# Patient Record
Sex: Male | Born: 1985 | Race: White | Hispanic: No | Marital: Single | Smoking: Current every day smoker
Health system: Southern US, Community
[De-identification: ages and names within clinical notes are randomized; demographics above are authoritative.]

---
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
## 2004-08-31 ENCOUNTER — Emergency Department: Payer: Self-pay | Admitting: Emergency Medicine

## 2007-02-13 ENCOUNTER — Emergency Department (HOSPITAL_COMMUNITY): Admission: EM | Admit: 2007-02-13 | Discharge: 2007-02-13 | Payer: Self-pay | Admitting: Family Medicine

## 2007-07-05 ENCOUNTER — Emergency Department: Payer: Self-pay | Admitting: Emergency Medicine

## 2008-07-17 ENCOUNTER — Emergency Department (HOSPITAL_COMMUNITY): Admission: EM | Admit: 2008-07-17 | Discharge: 2008-07-17 | Payer: Self-pay | Admitting: Family Medicine

## 2008-10-24 ENCOUNTER — Emergency Department (HOSPITAL_COMMUNITY): Admission: EM | Admit: 2008-10-24 | Discharge: 2008-10-24 | Payer: Self-pay | Admitting: Family Medicine

## 2010-04-21 LAB — CULTURE, ROUTINE-ABSCESS

## 2010-09-01 ENCOUNTER — Inpatient Hospital Stay (INDEPENDENT_AMBULATORY_CARE_PROVIDER_SITE_OTHER)
Admission: RE | Admit: 2010-09-01 | Discharge: 2010-09-01 | Disposition: A | Discharge status: Home / Self Care | Payer: BC Managed Care – PPO | Source: Ambulatory Visit | Attending: Emergency Medicine | Admitting: Emergency Medicine

## 2010-09-01 DIAGNOSIS — S81009A Unspecified open wound, unspecified knee, initial encounter: Secondary | ICD-10-CM

## 2010-09-03 ENCOUNTER — Inpatient Hospital Stay (INDEPENDENT_AMBULATORY_CARE_PROVIDER_SITE_OTHER)
Admission: RE | Admit: 2010-09-03 | Discharge: 2010-09-03 | Disposition: A | Discharge status: Home / Self Care | Payer: BC Managed Care – PPO | Source: Ambulatory Visit | Attending: Emergency Medicine | Admitting: Emergency Medicine

## 2010-09-03 DIAGNOSIS — S81009A Unspecified open wound, unspecified knee, initial encounter: Secondary | ICD-10-CM

## 2011-03-14 ENCOUNTER — Encounter (HOSPITAL_COMMUNITY): Payer: Self-pay

## 2011-03-14 ENCOUNTER — Emergency Department (HOSPITAL_COMMUNITY)
Admission: EM | Admit: 2011-03-14 | Discharge: 2011-03-14 | Disposition: A | Discharge status: Home Health | Payer: BC Managed Care – PPO | Source: Home / Self Care | Attending: Family Medicine | Admitting: Family Medicine

## 2011-03-14 ENCOUNTER — Other Ambulatory Visit: Payer: Self-pay

## 2011-03-14 DIAGNOSIS — R55 Syncope and collapse: Secondary | ICD-10-CM

## 2011-03-14 DIAGNOSIS — R002 Palpitations: Secondary | ICD-10-CM

## 2011-03-14 LAB — CBC
HCT: 41 % (ref 39.0–52.0)
MCH: 30.8 pg (ref 26.0–34.0)
MCHC: 36.1 g/dL — ABNORMAL HIGH (ref 30.0–36.0)
MCV: 85.4 fL (ref 78.0–100.0)
RDW: 12.1 % (ref 11.5–15.5)
WBC: 5.7 10*3/uL (ref 4.0–10.5)

## 2011-03-14 LAB — POCT I-STAT, CHEM 8
BUN: 15 mg/dL (ref 6–23)
Creatinine, Ser: 1 mg/dL (ref 0.50–1.35)
Sodium: 139 mEq/L (ref 135–145)
TCO2: 23 mmol/L (ref 0–100)

## 2011-03-14 NOTE — ED Provider Notes (Signed)
History     CSN: [id number]  Arrival date & time 03/14/11  4132   First MD Initiated Contact with Patient 03/14/11 1001      Chief Complaint  Patient presents with  . Shortness of Breath    (Consider location/radiation/quality/duration/timing/severity/associated sxs/prior treatment) HPI Comments: Jackson Wall presents for evaluation of lightheadedness, chest palpitations and syncope. He reports one episode of syncope while standing in line waiting for a table in a restaurant 2 weeks ago. He states he told his wife that he felt dizzy and immediately fainted. This was witnessed by his wife. He reports another episode yesterday, where he was playing a video game and felt. Chest discomfort, pressure, numbness, and tingling in his arms and palpitations. He stated he was told that his face went white. He did not lose consciousness at this time. He denies any personal history of heart disease. He denies any history of sudden death in any family member. His mother died at 1 of breast cancer. His father currently has throat cancer. His father also has heart disease and has had 2 heart attacks and several strokes. Both parents were smokers. [name] is a smoker as well, smoking a pack a day. His wife smokes also. They were married on Valentine's Day of 2013.  Patient is a 26 y.o. male presenting with palpitations. The history is provided by the patient and the spouse.  Palpitations  This is a new problem. The current episode started more than 1 week ago. The problem has not changed since onset.The problem is associated with an unknown factor. Associated symptoms include numbness, chest pain, chest pressure, near-syncope, syncope, dizziness and shortness of breath. Pertinent negatives include no exertional chest pressure. He has tried nothing for the symptoms.    History reviewed. No pertinent past medical history.  History reviewed. No pertinent past surgical history.  No family history on  file.  History  Substance Use Topics  . Smoking status: Current Everyday Smoker -- 1.0 packs/day  . Smokeless tobacco: Not on file  . Alcohol Use: Yes      Review of Systems  Constitutional: Negative.   HENT: Negative.   Eyes: Negative.   Respiratory: Positive for shortness of breath.   Cardiovascular: Positive for chest pain, palpitations, syncope and near-syncope.  Gastrointestinal: Negative.   Genitourinary: Negative.   Musculoskeletal: Negative.   Skin: Negative.   Neurological: Positive for dizziness, syncope and numbness.    Allergies  Review of patient's allergies indicates no known allergies.  Home Medications   Current Outpatient Rx  Name Route Sig Dispense Refill  . PERCOCET PO Oral Take by mouth.    Marland Kitchen PENICILLIN V POTASSIUM PO Oral Take by mouth.      BP 113/69  Pulse 92  Temp(Src) 97.9 F (36.6 C) (Oral)  Resp 24  SpO2 100%  Physical Exam  Nursing note and vitals reviewed. Constitutional: He is oriented to person, place, and time. He appears well-developed and well-nourished.  HENT:  Head: Normocephalic and atraumatic.  Eyes: EOM are normal.  Neck: Normal range of motion.  Cardiovascular: Normal rate, regular rhythm and normal heart sounds.   No murmur heard.      ECG: normal sinus rhythm, rate 72 bpm, no ST-T wave changes, no interval abnormalities; rhythm strip for 1 minute unremarkable  Pulmonary/Chest: Effort normal and breath sounds normal. He has no decreased breath sounds. He has no wheezes. He has no rhonchi.  Musculoskeletal: Normal range of motion.  Neurological: He is alert and oriented to person,  place, and time.  Skin: Skin is warm and dry.  Psychiatric: His behavior is normal.    ED Course  Procedures (including critical care time)  Labs Reviewed  CBC - Abnormal; Notable for the following:    MCHC 36.1 (*)    All other components within normal limits  POCT I-STAT, CHEM 8  TSH  T4   No results found.   1. Heart  palpitations   2. Syncope       MDM  Discussed case with Dr. Sharyn Lull; he will see [name] as an outpatient; ECG, CBC, and chemistry panel normal today; exam unremarkable; TSH pending.        Richardo Priest, MD 03/14/11 1122

## 2011-03-14 NOTE — Discharge Instructions (Signed)
Is unclear at this time, what is causing your symptoms. Based on my examination, and laboratory tests done today, including an EKG, everything appears normal. However, there still disease processes that require further evaluation. This should be done by cardiologist. I discussed your case with Dr. Sharyn Lull, a cardiologist. He has agreed to see you in his office as an outpatient. Please call his office and schedule an appointment at the next available time. Please call today. Please return to care immediately should your symptoms worsen prior to your followup appointment.

## 2011-03-14 NOTE — ED Notes (Signed)
Pt states feels like heart is racing/fluttering and like he can't get enough air.  Feels SOB.  Reports last night while playing x-box he experienced sharp pains in his chest, tingling in both arms and shivering.  Reports it lasted approx 2 hours and he finally fell asleep.  Reports feeling ok this am and went to work.  Also states that he had a syncopal episode 2 weeks ago.

## 2011-04-07 NOTE — ED Notes (Signed)
Pt. came to [facility] and requested a note saying he was here. He said he lost his note. I accessed pt.'s chart and saw that he got a note on 2/26 to return to work 2/27 and no strenuous activity until he is seen by cardiology. I redid the not like the original. I asked pt. if he had followed-up with cardiology. He said yes, he saw Dr. Karilyn Cota. I said very good and gave him the note. Vassie Moselle 04/07/2011

## 2011-05-27 ENCOUNTER — Emergency Department: Payer: Self-pay | Admitting: Internal Medicine

## 2012-03-05 ENCOUNTER — Emergency Department: Payer: Self-pay | Admitting: Emergency Medicine

## 2013-02-23 ENCOUNTER — Encounter (HOSPITAL_COMMUNITY): Payer: Self-pay | Admitting: Emergency Medicine

## 2013-02-23 ENCOUNTER — Emergency Department (HOSPITAL_COMMUNITY)
Admission: EM | Admit: 2013-02-23 | Discharge: 2013-02-23 | Disposition: A | Discharge status: Home / Self Care | Payer: BC Managed Care – PPO | Attending: Emergency Medicine | Admitting: Emergency Medicine

## 2013-02-23 DIAGNOSIS — W230XXA Caught, crushed, jammed, or pinched between moving objects, initial encounter: Secondary | ICD-10-CM | POA: Insufficient documentation

## 2013-02-23 DIAGNOSIS — F17200 Nicotine dependence, unspecified, uncomplicated: Secondary | ICD-10-CM | POA: Insufficient documentation

## 2013-02-23 DIAGNOSIS — Y929 Unspecified place or not applicable: Secondary | ICD-10-CM | POA: Insufficient documentation

## 2013-02-23 DIAGNOSIS — S6990XA Unspecified injury of unspecified wrist, hand and finger(s), initial encounter: Secondary | ICD-10-CM | POA: Insufficient documentation

## 2013-02-23 DIAGNOSIS — Y939 Activity, unspecified: Secondary | ICD-10-CM | POA: Insufficient documentation

## 2013-02-23 DIAGNOSIS — S6980XA Other specified injuries of unspecified wrist, hand and finger(s), initial encounter: Secondary | ICD-10-CM | POA: Insufficient documentation

## 2013-02-23 MED ORDER — HYDROCODONE-ACETAMINOPHEN 5-325 MG PO TABS
2.0000 | ORAL_TABLET | Freq: Once | ORAL | Status: AC
  Administered 2013-02-23: 2 via ORAL
  Filled 2013-02-23: qty 2

## 2013-02-23 NOTE — ED Notes (Signed)
Patient states that he slammed his finger in the hood of  Car yesterday

## 2013-02-23 NOTE — ED Provider Notes (Signed)
CSN: [id number]     Arrival date & time 02/23/13  1053 History   None    Chief Complaint  Patient presents with  . Finger Injury   (Consider location/radiation/quality/duration/timing/severity/associated sxs/prior Treatment) HPI 28 yo male presents with RIGHT index finder injury after having the hood of his car dropped on it yesterday. Patient states pain is throbbing, constant, 10/10 pain that is improved if he holds his finger above the level of his heart. Patient states that he does not think it is broken. Denies numbness or weakness in affected finger. Patient states he had his last tetanus shot last year. Patient presents today because he is worried about infection so wanted to come get his finger thoroughly cleaned.   History reviewed. No pertinent past medical history. History reviewed. No pertinent past surgical history. History reviewed. No pertinent family history. History  Substance Use Topics  . Smoking status: Current Every Day Smoker -- 1.00 packs/day  . Smokeless tobacco: Not on file  . Alcohol Use: Yes    Review of Systems  Constitutional: Negative for fever and chills.  Musculoskeletal: Negative for arthralgias.  Skin: Positive for wound.    Allergies  Review of patient's allergies indicates no known allergies.  Home Medications   Current Outpatient Rx  Name  Route  Sig  Dispense  Refill  . HYDROcodone-acetaminophen (NORCO/VICODIN) 5-325 MG per tablet   Oral   Take 1 tablet by mouth every 6 (six) hours as needed for moderate pain.          BP 120/65  Pulse 76  Temp(Src) 98.2 F (36.8 C) (Oral)  SpO2 100% Physical Exam  Nursing note and vitals reviewed. Constitutional: He is oriented to person, place, and time. He appears well-developed and well-nourished. No distress.  HENT:  Head: Normocephalic and atraumatic.  Eyes: Conjunctivae and EOM are normal.  Cardiovascular: Normal rate and regular rhythm.  Exam reveals no gallop and no friction rub.   No  murmur heard. Pulmonary/Chest: Effort normal and breath sounds normal. No respiratory distress. He has no wheezes. He has no rales.  Musculoskeletal: Normal range of motion. He exhibits no edema.  Distal tip of RIGHT index finger is missing superficial layer of epidermis. Dermis intact. No laceration present. Bleeding controlled. No subungual hematoma. Bruising of distal tip of finger. No bony tenderness. Full active range of motion at all joints of index finger. Good strength and sensation.   Neurological: He is alert and oriented to person, place, and time.  Skin: Skin is warm and dry. He is not diaphoretic.  Psychiatric: He has a normal mood and affect. His behavior is normal.    ED Course  Procedures (including critical care time) Labs Review Labs Reviewed - No data to display Imaging Review No results found.  EKG Interpretation   None      Digital Block Performed by: Rudene AndaLACKEY, Jahkai Yandell, GRAY Consent: Verbal consent obtained. Risks and benefits: risks, benefits and alternatives were discussed Patient identity confirmed: provided demographic data Time out performed prior to procedure Prepped and Draped in normal sterile fashion Wound explored No Foreign Bodies seen or palpated Anesthesia: local infiltration Local anesthetic: lidocaine 2% W/o epinephrine Anesthetic total: 3 ml Irrigation method: syringe Amount of cleaning: standard Patient tolerance: Patient tolerated the procedure well with no immediate complications.    MDM   1. Finger injury    Patient afebrile with normal VS.  No  Bony tenderness on exam and full ROM and strength of affected digit. No evidence of  foreign body.  No need for xray at this time.   The wound is cleansed, debrided of foreign material as much as possible, and dressed. The patient is alerted to watch for any signs of infection (redness, pus, pain, increased swelling or fever) and call if such occurs. Home wound care instructions are provided.  Tetanus vaccination status reviewed: last tetanus booster 1 year ago.  Meds given in ED:  Medications  HYDROcodone-acetaminophen (NORCO/VICODIN) 5-325 MG per tablet 2 tablet (2 tablets Oral Given 02/23/13 1159)    Discharge Medication List as of 02/23/2013 11:46 AM           Rudene Anda, PA-C 02/24/13 1244

## 2013-02-23 NOTE — Discharge Instructions (Signed)
Keep wound cleaned with warm water and antibacterial soap daily. Return to ED should you develop any signs of infection (swelling, redness, warmth, drainage, foul smell).

## 2013-02-25 NOTE — ED Provider Notes (Signed)
Medical screening examination/treatment/procedure(s) were performed by non-physician practitioner and as supervising physician I was immediately available for consultation/collaboration.  EKG Interpretation   None         Suzi RootsKevin E Fredy Gladu, MD 02/25/13 [phone]

## 2014-04-21 ENCOUNTER — Telehealth: Payer: Self-pay

## 2014-04-21 ENCOUNTER — Telehealth: Payer: Self-pay | Admitting: Cardiovascular Disease

## 2014-04-21 NOTE — Telephone Encounter (Signed)
Dr.Croitoru reviewed paper chart he advised no indication why patient cannot wear respiratory mask when working around chemicals at work.

## 2014-04-21 NOTE — Telephone Encounter (Signed)
Returned call to patient he wanted to ask Dr.Croitoru if he is ok to have a respiratory fit test at work.Stated he wanted to ask him due to his heart condition if ok to be around chemicals.Stated he saw Dr.Croitoru appox 1 to 2 years ago.Message sent to Dr.Croitoru for advice.

## 2014-04-21 NOTE — Telephone Encounter (Signed)
Returned call to patient Dr.Croitoru advised he will need to review paper chart.Advised he doubts he has a cardiac reason not to wear the respirator mask.Paper chart requested for Dr.Croitoru to review.

## 2014-04-21 NOTE — Telephone Encounter (Signed)
Will have to look at his paper chart - none of my notes in EPIC, but I doubt he has a cardiac reason not to wear the respirator mask

## 2014-04-21 NOTE — Telephone Encounter (Signed)
Pt need a letter stating that he can not wear the respiratory fit test. He can not be around chemical.Please call.

## 2016-06-26 ENCOUNTER — Emergency Department (HOSPITAL_COMMUNITY)
Admission: EM | Admit: 2016-06-26 | Discharge: 2016-06-26 | Disposition: A | Discharge status: Home / Self Care | Payer: Self-pay | Attending: Emergency Medicine | Admitting: Emergency Medicine

## 2016-06-26 ENCOUNTER — Encounter (HOSPITAL_COMMUNITY): Payer: Self-pay | Admitting: *Deleted

## 2016-06-26 ENCOUNTER — Emergency Department (HOSPITAL_COMMUNITY): Payer: Self-pay

## 2016-06-26 DIAGNOSIS — F17200 Nicotine dependence, unspecified, uncomplicated: Secondary | ICD-10-CM | POA: Insufficient documentation

## 2016-06-26 DIAGNOSIS — K529 Noninfective gastroenteritis and colitis, unspecified: Secondary | ICD-10-CM | POA: Insufficient documentation

## 2016-06-26 LAB — COMPREHENSIVE METABOLIC PANEL
ALBUMIN: 3.6 g/dL (ref 3.5–5.0)
ALK PHOS: 52 U/L (ref 38–126)
ALT: 16 U/L — AB (ref 17–63)
AST: 19 U/L (ref 15–41)
Anion gap: 9 (ref 5–15)
BUN: 8 mg/dL (ref 6–20)
CALCIUM: 8.9 mg/dL (ref 8.9–10.3)
CO2: 25 mmol/L (ref 22–32)
CREATININE: 0.98 mg/dL (ref 0.61–1.24)
Chloride: 103 mmol/L (ref 101–111)
GFR calc Af Amer: >60 mL/min (ref >60)
GFR calc non Af Amer: >60 mL/min (ref >60)
GLUCOSE: 140 mg/dL — AB (ref 65–99)
Potassium: 3.7 mmol/L (ref 3.5–5.1)
Sodium: 137 mmol/L (ref 135–145)
Total Bilirubin: 0.4 mg/dL (ref 0.3–1.2)
Total Protein: 6 g/dL — ABNORMAL LOW (ref 6.5–8.1)

## 2016-06-26 LAB — CBC
HCT: 39.9 % (ref 39.0–52.0)
Hemoglobin: 13.8 g/dL (ref 13.0–17.0)
MCH: 30.9 pg (ref 26.0–34.0)
MCHC: 34.6 g/dL (ref 30.0–36.0)
MCV: 89.3 fL (ref 78.0–100.0)
PLATELETS: 146 10*3/uL — AB (ref 150–400)
RBC: 4.47 MIL/uL (ref 4.22–5.81)
RDW: 12 % (ref 11.5–15.5)
WBC: 4.8 10*3/uL (ref 4.0–10.5)

## 2016-06-26 LAB — LIPASE, BLOOD: Lipase: 20 U/L (ref 11–51)

## 2016-06-26 MED ORDER — ONDANSETRON HCL 4 MG/2ML IJ SOLN
4.0000 mg | Freq: Once | INTRAMUSCULAR | Status: AC
  Administered 2016-06-26: 4 mg via INTRAVENOUS
  Filled 2016-06-26: qty 2

## 2016-06-26 MED ORDER — MORPHINE SULFATE (PF) 4 MG/ML IV SOLN
4.0000 mg | Freq: Once | INTRAVENOUS | Status: AC
  Administered 2016-06-26: 4 mg via INTRAVENOUS
  Filled 2016-06-26: qty 1

## 2016-06-26 MED ORDER — IOPAMIDOL (ISOVUE-300) INJECTION 61%
INTRAVENOUS | Status: AC
  Administered 2016-06-26: 100 mL
  Filled 2016-06-26: qty 100

## 2016-06-26 MED ORDER — SODIUM CHLORIDE 0.9 % IV BOLUS (SEPSIS)
1000.0000 mL | Freq: Once | INTRAVENOUS | Status: AC
  Administered 2016-06-26: 1000 mL via INTRAVENOUS

## 2016-06-26 NOTE — ED Provider Notes (Signed)
[facility]-EMERGENCY DEPT Provider Note   CSN: [id number] Arrival date & time: 06/26/16  0736     History   Chief Complaint Chief Complaint  Patient presents with  . Abdominal Pain    HPI Jackson Wall is a 31 y.o. male.  The history is provided by the patient and medical records.  Abdominal Pain   This is a new problem. The current episode started more than 2 days ago. The problem occurs constantly. The problem has not changed since onset.The pain is located in the RLQ. The quality of the pain is aching. The pain is at a severity of 9/10. The pain is severe. Associated symptoms include anorexia, fever (tactile), diarrhea and nausea. Pertinent negatives include vomiting, dysuria, hematuria and arthralgias. Nothing aggravates the symptoms. Nothing relieves the symptoms.    History reviewed. No pertinent past medical history.  There are no active problems to display for this patient.   History reviewed. No pertinent surgical history.     Home Medications    Prior to Admission medications   Medication Sig Start Date End Date Taking? Authorizing Provider  HYDROcodone-acetaminophen (NORCO/VICODIN) 5-325 MG per tablet Take 1 tablet by mouth every 6 (six) hours as needed for moderate pain.    [provider]    Family History No family history on file.  Social History Social History  Substance Use Topics  . Smoking status: Current Every Day Smoker    Packs/day: 1.00  . Smokeless tobacco: Never Used  . Alcohol use Yes     Allergies   Patient has no known allergies.   Review of Systems Review of Systems  Constitutional: Positive for fever (tactile). Negative for chills.  HENT: Negative for ear pain and sore throat.   Eyes: Negative for pain and visual disturbance.  Respiratory: Negative for cough and shortness of breath.   Cardiovascular: Negative for chest pain and palpitations.  Gastrointestinal: Positive for abdominal pain, anorexia, diarrhea and nausea.  Negative for vomiting.  Genitourinary: Negative for dysuria and hematuria.  Musculoskeletal: Negative for arthralgias and back pain.  Skin: Negative for color change and rash.  Neurological: Negative for seizures and syncope.  All other systems reviewed and are negative.    Physical Exam Updated Vital Signs BP 111/60   Pulse (!) 55   Temp 98.3 F (36.8 C) (Oral)   Resp 17   Ht 6\' 1"  (1.854 m)   Wt 68 kg (150 lb)   SpO2 99%   BMI 19.79 kg/m   Physical Exam  Constitutional: He appears well-developed. He appears ill.  HENT:  Head: Normocephalic and atraumatic.  Eyes: Conjunctivae are normal.  Neck: Neck supple.  Cardiovascular: Normal rate and regular rhythm.   No murmur heard. Pulmonary/Chest: Effort normal and breath sounds normal. No respiratory distress.  Abdominal: Soft. There is tenderness (TTP at McBurney's pt).  Negative Rovsing's or Obturator sign  Musculoskeletal: He exhibits no edema.  Neurological: He is alert. No cranial nerve deficit. Coordination normal.  5/5 motor strength and intact sensation in all extremities. Intact bilateral finger-to-nose coordination  Skin: Skin is warm and dry.  Nursing note and vitals reviewed.    ED Treatments / Results  Labs (all labs ordered are listed, but only abnormal results are displayed) Labs Reviewed  COMPREHENSIVE METABOLIC PANEL - Abnormal; Notable for the following:       Result Value   Glucose, Bld 140 (*)    Total Protein 6.0 (*)    ALT 16 (*)    All  other components within normal limits  CBC - Abnormal; Notable for the following:    Platelets 146 (*)    All other components within normal limits  LIPASE, BLOOD    EKG  EKG Interpretation None       Radiology Ct Abdomen Pelvis W Contrast  Result Date: 06/26/2016 CLINICAL DATA:  Right lower quadrant abdominal pain over the last 3 days. nausea. EXAM: CT ABDOMEN AND PELVIS WITH CONTRAST TECHNIQUE: Multidetector CT imaging of the abdomen and pelvis was  performed using the standard protocol following bolus administration of intravenous contrast. CONTRAST:  100 cc Isovue-300 COMPARISON:  None. FINDINGS: Lower chest: Normal Hepatobiliary: Liver parenchyma is normal. No calcified gallstones. There is intra and extrahepatic biliary ductal dilatation with the common duct measuring up to 11 mm in the pancreatic head. No obstructing lesion is seen. Pancreas: Normal except for mild prominence of the pancreatic duct. Spleen: Normal Adrenals/Urinary Tract: Adrenal glands are normal. Kidneys are normal. No cyst, mass, stone or hydronephrosis. Stomach/Bowel: There is wall thickening/edema of the colon from the cecum to the mid transverse colon. This is nonspecific and could be due to infectious enteritis or inflammatory bowel disease. Appendix is shows mild wall thickening in proportion to the remainder of the right colon. No surrounding soft tissue inflammation. No abnormality of the terminal ileum is identified. No sign of bowel obstruction. No free fluid or air. Right-sided mesenteric nodes are slightly prominent, reactive Vascular/Lymphatic: Normal Reproductive: Normal Other: Normal Musculoskeletal: Normal IMPRESSION: Thickening and wall edema of the right colon from the cecum to the mid transverse colon. Appendix also shows generalized wall thickening without surrounding inflammatory change. Findings most likely secondary to infectious enteritis or inflammatory bowel disease. While appendicitis cannot be excluded, I would doubt that is present in this case. Intrahepatic and extra hepatic biliary ductal dilatation of unknown etiology. Common bile duct measures up to 11 mm. Pancreatic duct is also slightly prominent. I do not see a hyperdense stone or discernible pancreatic head/ ampullary mass. MRI of the abdomen/MRCP with and without contrast recommended for evaluation of this finding. Electronically Signed   By: Paulina Fusi M.D.   On: 06/26/2016 10:47     Procedures Procedures (including critical care time)  Medications Ordered in ED Medications  sodium chloride 0.9 % bolus 1,000 mL (0 mLs Intravenous Stopped 06/26/16 1157)  morphine 4 MG/ML injection 4 mg (4 mg Intravenous Given 06/26/16 0845)  ondansetron (ZOFRAN) injection 4 mg (4 mg Intravenous Given 06/26/16 0845)  iopamidol (ISOVUE-300) 61 % injection (100 mLs  Contrast Given 06/26/16 1019)     Initial Impression / Assessment and Plan / ED Course  I have reviewed the triage vital signs and the nursing notes.  Pertinent labs & imaging results that were available during my care of the patient were reviewed by me and considered in my medical decision making (see chart for details).     31 year old male previously healthy who presents with 3 days of worsening RLQ pain. Initially with periumbilical pain that migrated to RLQ. Endorses anorexia, tactile fevers/chills, nausea, diarrhea. Severe pain with walking. Pain worsened over past day. Pt concerned about appendicitis.  AF, VSS. Lungs CTAB. TTP at McBurney's point. Negative Rovsign's or Obturator sign. Positive jolt sign.  CBC showing WBC 4.8k, Hgb 13.8. CMP, lipase unremarkable.   CT A/P showing findings of likely infectious enteritis. Pt also informed of incidental CBD dilatation and need for PCP followup. Encouraged patient to continue fluids, ibuprofen/tylenol prn for pain, and close PCP f/u.  He will return for acute worsening of symptoms.  Return precautions provided for worsening symptoms. Pt will f/u with PCP at first availability. Pt verbalized agreement with plan.  Pt care d/w Dr. Adela Lank  Final Clinical Impressions(s) / ED Diagnoses   Final diagnoses:  Enteritis    New Prescriptions Discharge Medication List as of 06/26/2016 12:05 PM       Wyman Meschke, Homero Fellers, MD 06/26/16 2317    Melene Plan, DO 06/27/16 1003

## 2016-06-26 NOTE — ED Triage Notes (Signed)
Pt reports RLQ abd pain /10 intermittent, pt reports diarrhea x7 onset x 2 days, pt deneis n/v, pt A&O x4

## 2016-06-26 NOTE — Discharge Instructions (Signed)
Fluids as tolerated. Ibuprofen/tylenol as needed for pain. Followup with primary doctor for evaluation of biliary duct dilation. Return to ED for sudden worsening of pain.

## 2017-10-23 ENCOUNTER — Ambulatory Visit (HOSPITAL_COMMUNITY)
Admission: EM | Admit: 2017-10-23 | Discharge: 2017-10-23 | Disposition: A | Discharge status: Home / Self Care | Payer: No Typology Code available for payment source | Attending: Emergency Medicine | Admitting: Emergency Medicine

## 2017-10-23 ENCOUNTER — Encounter (HOSPITAL_COMMUNITY): Payer: Self-pay

## 2017-10-23 ENCOUNTER — Emergency Department (HOSPITAL_COMMUNITY): Payer: No Typology Code available for payment source | Admitting: Certified Registered Nurse Anesthetist

## 2017-10-23 ENCOUNTER — Emergency Department (HOSPITAL_COMMUNITY): Payer: No Typology Code available for payment source

## 2017-10-23 ENCOUNTER — Other Ambulatory Visit: Payer: Self-pay

## 2017-10-23 ENCOUNTER — Ambulatory Visit: Admit: 2017-10-23 | Payer: Self-pay | Admitting: General Surgery

## 2017-10-23 ENCOUNTER — Encounter (HOSPITAL_COMMUNITY)
Admission: EM | Disposition: A | Discharge status: Home / Self Care | Payer: Self-pay | Source: Home / Self Care | Attending: Emergency Medicine

## 2017-10-23 DIAGNOSIS — Y990 Civilian activity done for income or pay: Secondary | ICD-10-CM | POA: Insufficient documentation

## 2017-10-23 DIAGNOSIS — Y9289 Other specified places as the place of occurrence of the external cause: Secondary | ICD-10-CM | POA: Insufficient documentation

## 2017-10-23 DIAGNOSIS — W3189XA Contact with other specified machinery, initial encounter: Secondary | ICD-10-CM | POA: Insufficient documentation

## 2017-10-23 DIAGNOSIS — Z23 Encounter for immunization: Secondary | ICD-10-CM | POA: Insufficient documentation

## 2017-10-23 DIAGNOSIS — S68119A Complete traumatic metacarpophalangeal amputation of unspecified finger, initial encounter: Secondary | ICD-10-CM

## 2017-10-23 DIAGNOSIS — S68123A Partial traumatic metacarpophalangeal amputation of left middle finger, initial encounter: Secondary | ICD-10-CM | POA: Diagnosis not present

## 2017-10-23 DIAGNOSIS — F17210 Nicotine dependence, cigarettes, uncomplicated: Secondary | ICD-10-CM | POA: Insufficient documentation

## 2017-10-23 HISTORY — PX: AMPUTATION: SHX166

## 2017-10-23 LAB — POCT I-STAT 4, (NA,K, GLUC, HGB,HCT)
GLUCOSE: 103 mg/dL — AB (ref 70–99)
HCT: 39 % (ref 39.0–52.0)
Hemoglobin: 13.3 g/dL (ref 13.0–17.0)
Potassium: 3.7 mmol/L (ref 3.5–5.1)
Sodium: 143 mmol/L (ref 135–145)

## 2017-10-23 SURGERY — AMPUTATION DIGIT
Anesthesia: General | Site: Arm Lower | Laterality: Left

## 2017-10-23 MED ORDER — BUPIVACAINE HCL (PF) 0.25 % IJ SOLN
INTRAMUSCULAR | Status: DC | PRN
  Administered 2017-10-23: 10 mL

## 2017-10-23 MED ORDER — HYDROCODONE-ACETAMINOPHEN 5-325 MG PO TABS
ORAL_TABLET | ORAL | Status: AC
  Filled 2017-10-23: qty 2

## 2017-10-23 MED ORDER — TETANUS-DIPHTH-ACELL PERTUSSIS 5-2.5-18.5 LF-MCG/0.5 IM SUSP
0.5000 mL | Freq: Once | INTRAMUSCULAR | Status: AC
  Administered 2017-10-23: 0.5 mL via INTRAMUSCULAR
  Filled 2017-10-23: qty 0.5

## 2017-10-23 MED ORDER — 0.9 % SODIUM CHLORIDE (POUR BTL) OPTIME
TOPICAL | Status: DC | PRN
  Administered 2017-10-23: 1000 mL

## 2017-10-23 MED ORDER — CEFAZOLIN SODIUM-DEXTROSE 2-3 GM-%(50ML) IV SOLR
INTRAVENOUS | Status: DC | PRN
  Administered 2017-10-23: 2 g via INTRAVENOUS

## 2017-10-23 MED ORDER — ROCURONIUM BROMIDE 50 MG/5ML IV SOSY
PREFILLED_SYRINGE | INTRAVENOUS | Status: AC
  Filled 2017-10-23: qty 10

## 2017-10-23 MED ORDER — SUCCINYLCHOLINE CHLORIDE 200 MG/10ML IV SOSY
PREFILLED_SYRINGE | INTRAVENOUS | Status: DC | PRN
  Administered 2017-10-23: 120 mg via INTRAVENOUS

## 2017-10-23 MED ORDER — PROPOFOL 10 MG/ML IV BOLUS
INTRAVENOUS | Status: AC
  Filled 2017-10-23: qty 20

## 2017-10-23 MED ORDER — DEXAMETHASONE SODIUM PHOSPHATE 10 MG/ML IJ SOLN
INTRAMUSCULAR | Status: AC
  Filled 2017-10-23: qty 2

## 2017-10-23 MED ORDER — HYDROCODONE-ACETAMINOPHEN 5-325 MG PO TABS: 1.0000 | ORAL_TABLET | Freq: Four times a day (QID) | ORAL | 0 refills | Status: AC | PRN

## 2017-10-23 MED ORDER — HYDROCODONE-ACETAMINOPHEN 5-325 MG PO TABS
2.0000 | ORAL_TABLET | Freq: Once | ORAL | Status: AC
  Administered 2017-10-23: 2 via ORAL

## 2017-10-23 MED ORDER — ONDANSETRON HCL 4 MG/2ML IJ SOLN
INTRAMUSCULAR | Status: AC
  Filled 2017-10-23: qty 4

## 2017-10-23 MED ORDER — MIDAZOLAM HCL 5 MG/5ML IJ SOLN
INTRAMUSCULAR | Status: DC | PRN
  Administered 2017-10-23: 2 mg via INTRAVENOUS

## 2017-10-23 MED ORDER — FENTANYL CITRATE (PF) 100 MCG/2ML IJ SOLN: 25.0000 ug | INTRAMUSCULAR | Status: DC | PRN

## 2017-10-23 MED ORDER — BUPIVACAINE HCL (PF) 0.25 % IJ SOLN
INTRAMUSCULAR | Status: AC
  Filled 2017-10-23: qty 30

## 2017-10-23 MED ORDER — ONDANSETRON HCL 4 MG/2ML IJ SOLN
INTRAMUSCULAR | Status: DC | PRN
  Administered 2017-10-23: 4 mg via INTRAVENOUS

## 2017-10-23 MED ORDER — LIDOCAINE 2% (20 MG/ML) 5 ML SYRINGE
INTRAMUSCULAR | Status: AC
  Filled 2017-10-23: qty 10

## 2017-10-23 MED ORDER — MIDAZOLAM HCL 2 MG/2ML IJ SOLN
INTRAMUSCULAR | Status: AC
  Filled 2017-10-23: qty 2

## 2017-10-23 MED ORDER — LIDOCAINE HCL (CARDIAC) PF 100 MG/5ML IV SOSY
PREFILLED_SYRINGE | INTRAVENOUS | Status: DC | PRN
  Administered 2017-10-23: 100 mg via INTRAVENOUS

## 2017-10-23 MED ORDER — FENTANYL CITRATE (PF) 250 MCG/5ML IJ SOLN
INTRAMUSCULAR | Status: AC
  Filled 2017-10-23: qty 5

## 2017-10-23 MED ORDER — ONDANSETRON HCL 4 MG/2ML IJ SOLN
INTRAMUSCULAR | Status: AC
  Filled 2017-10-23: qty 2

## 2017-10-23 MED ORDER — LACTATED RINGERS IV SOLN
INTRAVENOUS | Status: DC
  Administered 2017-10-23: 18:00:00 via INTRAVENOUS

## 2017-10-23 MED ORDER — CEPHALEXIN 500 MG PO CAPS: 500.0000 mg | ORAL_CAPSULE | Freq: Four times a day (QID) | ORAL | 0 refills | Status: AC

## 2017-10-23 MED ORDER — SUCCINYLCHOLINE CHLORIDE 200 MG/10ML IV SOSY
PREFILLED_SYRINGE | INTRAVENOUS | Status: AC
  Filled 2017-10-23: qty 10

## 2017-10-23 MED ORDER — EPHEDRINE 5 MG/ML INJ
INTRAVENOUS | Status: AC
  Filled 2017-10-23: qty 10

## 2017-10-23 MED ORDER — PROPOFOL 10 MG/ML IV BOLUS
INTRAVENOUS | Status: DC | PRN
  Administered 2017-10-23: 170 mg via INTRAVENOUS

## 2017-10-23 MED ORDER — DEXAMETHASONE SODIUM PHOSPHATE 10 MG/ML IJ SOLN
INTRAMUSCULAR | Status: DC | PRN
  Administered 2017-10-23: 10 mg via INTRAVENOUS

## 2017-10-23 MED ORDER — CEFAZOLIN SODIUM-DEXTROSE 1-4 GM/50ML-% IV SOLN
1.0000 g | Freq: Once | INTRAVENOUS | Status: AC
  Administered 2017-10-23: 1 g via INTRAVENOUS
  Filled 2017-10-23: qty 50

## 2017-10-23 MED ORDER — FENTANYL CITRATE (PF) 100 MCG/2ML IJ SOLN
INTRAMUSCULAR | Status: DC | PRN
  Administered 2017-10-23: 50 ug via INTRAVENOUS
  Administered 2017-10-23: 100 ug via INTRAVENOUS

## 2017-10-23 MED ORDER — BACITRACIN ZINC 500 UNIT/GM EX OINT
TOPICAL_OINTMENT | CUTANEOUS | Status: AC
  Filled 2017-10-23: qty 28.35

## 2017-10-23 SURGICAL SUPPLY — 38 items
BANDAGE ACE 4X5 VEL STRL LF (GAUZE/BANDAGES/DRESSINGS) ×3 IMPLANT
BNDG COHESIVE 1X5 TAN STRL LF (GAUZE/BANDAGES/DRESSINGS) ×3 IMPLANT
BNDG ESMARK 4X9 LF (GAUZE/BANDAGES/DRESSINGS) ×3 IMPLANT
BNDG GAUZE ELAST 4 BULKY (GAUZE/BANDAGES/DRESSINGS) ×3 IMPLANT
BNDG STRETCH 4X75 NS LF (GAUZE/BANDAGES/DRESSINGS) ×3 IMPLANT
CHLORAPREP W/TINT 26ML (MISCELLANEOUS) ×3 IMPLANT
CORDS BIPOLAR (ELECTRODE) ×3 IMPLANT
COVER WAND RF STERILE (DRAPES) ×3 IMPLANT
DRESSING MATRIX 2X2 BILAYER (Tissue) ×1 IMPLANT
DRSG ADAPTIC 3X8 NADH LF (GAUZE/BANDAGES/DRESSINGS) ×3 IMPLANT
DRSG MATRIX 2X2 BILAYER (Tissue) ×3 IMPLANT
ELECT REM PT RETURN 9FT ADLT (ELECTROSURGICAL)
ELECTRODE REM PT RTRN 9FT ADLT (ELECTROSURGICAL) IMPLANT
GAUZE SPONGE 4X4 12PLY STRL (GAUZE/BANDAGES/DRESSINGS) ×3 IMPLANT
GAUZE SPONGE 4X4 16PLY XRAY LF (GAUZE/BANDAGES/DRESSINGS) ×3 IMPLANT
GAUZE XEROFORM 1X8 LF (GAUZE/BANDAGES/DRESSINGS) ×3 IMPLANT
GLOVE BIOGEL M 8.0 STRL (GLOVE) ×3 IMPLANT
GOWN STRL REUS W/ TWL LRG LVL3 (GOWN DISPOSABLE) ×1 IMPLANT
GOWN STRL REUS W/ TWL XL LVL3 (GOWN DISPOSABLE) ×1 IMPLANT
GOWN STRL REUS W/TWL LRG LVL3 (GOWN DISPOSABLE) ×2
GOWN STRL REUS W/TWL XL LVL3 (GOWN DISPOSABLE) ×2
KIT BASIN OR (CUSTOM PROCEDURE TRAY) ×3 IMPLANT
KIT TURNOVER KIT B (KITS) ×3 IMPLANT
NS IRRIG 1000ML POUR BTL (IV SOLUTION) ×3 IMPLANT
PACK ORTHO EXTREMITY (CUSTOM PROCEDURE TRAY) ×3 IMPLANT
PAD ARMBOARD 7.5X6 YLW CONV (MISCELLANEOUS) ×6 IMPLANT
PAD CAST 4YDX4 CTTN HI CHSV (CAST SUPPLIES) ×1 IMPLANT
PADDING CAST COTTON 4X4 STRL (CAST SUPPLIES) ×2
SOAP 2 % CHG 4 OZ (WOUND CARE) ×3 IMPLANT
SPONGE LAP 18X18 X RAY DECT (DISPOSABLE) ×3 IMPLANT
SPONGE LAP 4X18 RFD (DISPOSABLE) ×3 IMPLANT
SUT ETHILON 5 0 PS 2 18 (SUTURE) ×3 IMPLANT
TOWEL OR 17X24 6PK STRL BLUE (TOWEL DISPOSABLE) ×3 IMPLANT
TOWEL OR 17X26 10 PK STRL BLUE (TOWEL DISPOSABLE) ×3 IMPLANT
TUBE CONNECTING 12'X1/4 (SUCTIONS)
TUBE CONNECTING 12X1/4 (SUCTIONS) IMPLANT
WATER STERILE IRR 1000ML POUR (IV SOLUTION) ×3 IMPLANT
YANKAUER SUCT BULB TIP NO VENT (SUCTIONS) IMPLANT

## 2017-10-23 NOTE — Anesthesia Preprocedure Evaluation (Addendum)
Anesthesia Evaluation  Patient identified by MRN, date of birth, ID band Patient awake    Reviewed: Allergy & Precautions, Patient's Chart, lab work & pertinent test results  History of Anesthesia Complications Negative for: history of anesthetic complications  Airway Mallampati: I  TM Distance: >3 FB     Dental   Pulmonary Current Smoker,    breath sounds clear to auscultation       Cardiovascular negative cardio ROS   Rhythm:Regular Rate:Normal     Neuro/Psych negative neurological ROS  negative psych ROS   GI/Hepatic negative GI ROS, Neg liver ROS,   Endo/Other  negative endocrine ROS  Renal/GU negative Renal ROS  negative genitourinary   Musculoskeletal negative musculoskeletal ROS (+)   Abdominal   Peds  Hematology negative hematology ROS (+)   Anesthesia Other Findings   Reproductive/Obstetrics                            Anesthesia Physical Anesthesia Plan  ASA: I and emergent  Anesthesia Plan: General   Post-op Pain Management:    Induction: Intravenous  PONV Risk Score and Plan: Ondansetron, Midazolam and Dexamethasone  Airway Management Planned: Oral ETT  Additional Equipment:   Intra-op Plan:   Post-operative Plan: Extubation in OR  Informed Consent: I have reviewed the patients History and Physical, chart, labs and discussed the procedure including the risks, benefits and alternatives for the proposed anesthesia with the patient or authorized representative who has indicated his/her understanding and acceptance.   Dental advisory given  Plan Discussed with: CRNA and Anesthesiologist  Anesthesia Plan Comments:         Anesthesia Quick Evaluation

## 2017-10-23 NOTE — Discharge Instructions (Signed)
Keep dressing clean, dry and intact until sees MD

## 2017-10-23 NOTE — H&P (Signed)
Reason for Consult:finger tip amputation Referring Physician: ER  CC: I smashed the tip of my finger off  HPI:  Jackson Wall is an 32 y.o. right  handed male who presents with  Complete amputation of left middle finger tip at level of proximal nail.  Pt got finger smashed by heavy fork lift equipment at work/ doesn't recall details of the incident.  C/o distal finger tip amputated, bleeding, pain.         Pain is rated at    10/10 and is described as sharp.  Pain is constant.  Pain is made better by rest/immobilization, worse with motion.   Associated signs/symptoms:  As above Previous treatment:    History reviewed. No pertinent past medical history.  History reviewed. No pertinent surgical history.  History reviewed. No pertinent family history.  Social History:  reports that he has been smoking. He has been smoking about 1.00 pack per day. He has never used smokeless tobacco. He reports that he drinks alcohol. He reports that he does not use drugs.  Allergies: No Known Allergies  Medications: I have reviewed the patient's current medications.  No results found for this or any previous visit (from the past 48 hour(s)).  Dg Finger Middle Left  Result Date: 10/23/2017 CLINICAL DATA:  Left middle finger injury. EXAM: LEFT MIDDLE FINGER 2+V COMPARISON:  None. FINDINGS: Status post traumatic amputation of distal tuft of third distal phalanx and significant amount of soft tissue. No radiopaque foreign body is noted. No other bony abnormality is noted. IMPRESSION: Status posttraumatic and protection of distal tuft of third distal phalanx and surrounding soft tissues. Electronically Signed   By: Lupita Raider, M.D.   On: 10/23/2017 16:52    Pertinent items are noted in HPI. Temp:  [98.6 F (37 C)] 98.6 F (37 C) (10/08 1547) Pulse Rate:  [108] 108 (10/08 1547) Resp:  [16] 16 (10/08 1547) BP: (138)/(82) 138/82 (10/08 1547) SpO2:  [100 %] 100 % (10/08 1547) Weight:  [77.1 kg] 77.1 kg  (10/08 1550) General appearance: alert and cooperative Resp: clear to auscultation bilaterally Cardio: regular rate and rhythm GI: soft, non-tender; bowel sounds normal; no masses,  no organomegaly Extremities: extremities normal, atraumatic, no cyanosis or edema Except for left middle finger with complete transverse amputation of finger at base of nail, bone exposed  Assessment: Partial tip amputation of left middle finger Plan: Will evaluate in OR either revise of ? Integra/skin graft; discussed both options with patient I have discussed this treatment plan in detail with patient , including the risks of the recommended treatment and surgery, the benefits and the alternatives.  The patient understands that additional treatment may be necessary.  Joselyne Spake C Ava Tangney 10/23/2017, 6:11 PM

## 2017-10-23 NOTE — Anesthesia Procedure Notes (Signed)
Procedure Name: Intubation Date/Time: 10/23/2017 6:59 PM Performed by: Bryson Corona, CRNA Pre-anesthesia Checklist: Patient identified, Emergency Drugs available, Suction available and Patient being monitored Patient Re-evaluated:Patient Re-evaluated prior to induction Oxygen Delivery Method: Circle System Utilized Preoxygenation: Pre-oxygenation with 100% oxygen Induction Type: IV induction, Rapid sequence and Cricoid Pressure applied Laryngoscope Size: Mac and 4 Grade View: Grade I Tube type: Oral Tube size: 7.0 mm Number of attempts: 1 Airway Equipment and Method: Stylet and Oral airway Placement Confirmation: ETT inserted through vocal cords under direct vision,  positive ETCO2 and breath sounds checked- equal and bilateral Secured at: 22 cm Tube secured with: Tape Dental Injury: Teeth and Oropharynx as per pre-operative assessment

## 2017-10-23 NOTE — Anesthesia Postprocedure Evaluation (Signed)
Anesthesia Post Note  Patient: Jackson Wall  Procedure(s) Performed: REVISION AMPUTATION LEFT MIDFINGER (Left Arm Lower)     Patient location during evaluation: PACU Anesthesia Type: General Level of consciousness: awake Pain management: pain level controlled Vital Signs Assessment: post-procedure vital signs reviewed and stable Respiratory status: spontaneous breathing Cardiovascular status: stable Postop Assessment: no apparent nausea or vomiting Anesthetic complications: no    Last Vitals:  Vitals:   10/23/17 2006 10/23/17 2015  BP: 116/71   Pulse:  76  Resp:  19  Temp:    SpO2:  100%    Last Pain:  Vitals:   10/23/17 2015  TempSrc:   PainSc: 5                  Shamanda Len

## 2017-10-23 NOTE — ED Provider Notes (Signed)
[facility] EMERGENCY DEPARTMENT Provider Note   CSN: [id number] Arrival date & time: 10/23/17  1539     History   Chief Complaint Chief Complaint  Patient presents with  . Finger Injury    HPI Jackson Wall is a 32 y.o. male.  Jackson Wall is a 32 y.o. Male who is otherwise healthy, presents to the emergency department from work for evaluation of finger injury.  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  Dressing applied and bleeding is controlled.  Coworker placed the fingertip in a bag on ice and brought it in as well.  Patient is able to move the finger at all joints and denies numbness or tingling, but reports significant pain over the tip of the finger.  He denies any prior surgeries or injuries to this finger.  Is unsure when his last tetanus vaccination was.  No pain medications prior to arrival.  Last ate around noon or 1 today.      History reviewed. No pertinent past medical history.  There are no active problems to display for this patient.   History reviewed. No pertinent surgical history.      Home Medications    Prior to Admission medications   Medication Sig Start Date End Date Taking? Authorizing Provider  HYDROcodone-acetaminophen (NORCO/VICODIN) 5-325 MG per tablet Take 1 tablet by mouth every 6 (six) hours as needed for moderate pain.    [provider]    Family History History reviewed. No pertinent family history.  Social History Social History   Tobacco Use  . Smoking status: Current Every Day Smoker    Packs/day: 1.00  . Smokeless tobacco: Never Used  Substance Use Topics  . Alcohol use: Yes  . Drug use: No     Allergies   Patient has no known allergies.   Review of Systems Review of Systems    Constitutional: Negative for chills and fever.  Musculoskeletal: Positive for arthralgias.  Skin: Positive for wound.  Neurological: Negative for weakness and numbness.  All other systems reviewed and are negative.    Physical Exam Updated Vital Signs BP 138/82 (BP Location: Right Arm)   Pulse (!) 108   Temp 98.6 F (37 C) (Oral)   Resp 16   Ht 6\' 1"  (1.854 m)   Wt 77.1 kg   SpO2 100%   BMI 22.43 kg/m   Physical Exam  Constitutional: He appears well-developed and well-nourished. No distress.  HENT:  Head: Normocephalic and atraumatic.  Eyes: Right eye exhibits no discharge. Left eye exhibits no discharge.  Neck: Neck supple.  Cardiovascular: Normal rate, regular rhythm, normal heart sounds and intact distal pulses.  Pulmonary/Chest: Effort normal and breath sounds normal. No respiratory distress.  Abdominal: Soft. Bowel sounds are normal. He exhibits no distension. There is no tenderness.  Musculoskeletal: He exhibits deformity.  Traumatic amputation of the left third fingertip at the level of the base of the nailbed, bleeding is controlled, there is no obvious bone protruding and no obvious foreign body, normal range of motion at the joints, sensation intact, 2+ radial pulse. (See photo below)  Neurological: He is alert. Coordination normal.  Skin: Skin is warm and dry. Capillary refill takes less than 2 seconds. He is not diaphoretic.  Psychiatric: He has a normal mood and affect. His behavior is normal.  Nursing note and vitals reviewed.          ED Treatments / Results  Labs (all labs ordered are listed, but only abnormal results are displayed) Labs Reviewed - No data to display  EKG None  Radiology Dg Finger Middle Left  Result Date: 10/23/2017 CLINICAL DATA:  Left middle finger injury. EXAM: LEFT MIDDLE FINGER 2+V COMPARISON:  None. FINDINGS: Status post traumatic amputation of distal tuft of third distal phalanx and significant amount of soft tissue.  No radiopaque foreign body is noted. No other bony abnormality is noted. IMPRESSION: Status posttraumatic and protection of distal tuft of third distal phalanx and surrounding soft tissues. Electronically Signed   By: Lupita Raider, M.D.   On: 10/23/2017 16:52    Procedures Procedures (including critical care time)  Medications Ordered in ED Medications  Tdap (BOOSTRIX) injection 0.5 mL (has no administration in time range)  ceFAZolin (ANCEF) IVPB 1 g/50 mL premix (has no administration in time range)     Initial Impression / Assessment and Plan / ED Course  I have reviewed the triage vital signs and the nursing notes.  Pertinent labs & imaging results that were available during my care of the patient were reviewed by me and considered in my medical decision making (see chart for details).  Patient with traumatic amputation of the left third fingertip at the base of the nailbed.  Tetanus updated here in the emergency department.  X-ray shows status post traumatic amputation of the distal tuft of the third distal phalanx and surrounding soft tissues.  Case was discussed with Dr. Izora Ribas with hand surgery, patient will need revision in the OR, patient will be made n.p.o., given 1 dose of Ancef for preop antibiotics, and will be taken to the OR later this evening.  Patient's employer is at bedside and is faxing over paperwork for workers comp drug test so we will avoid pain medication until this can be collected.  Final Clinical Impressions(s) / ED Diagnoses   Final diagnoses:  Traumatic amputation of tip of finger of left hand    ED Discharge Orders    None       Legrand Rams 10/23/17 1738    Linwood Dibbles, MD 10/24/17 2333

## 2017-10-23 NOTE — Transfer of Care (Signed)
Immediate Anesthesia Transfer of Care Note  Patient: Jackson Wall  Procedure(s) Performed: REVISION AMPUTATION LEFT MIDFINGER (Left )  Patient Location: PACU  Anesthesia Type:General  Level of Consciousness: awake and alert   Airway & Oxygen Therapy: Patient Spontanous Breathing and Patient connected to face mask oxygen  Post-op Assessment: Report given to RN and Post -op Vital signs reviewed and stable  Post vital signs: Reviewed and stable  Last Vitals:  Vitals Value Taken Time  BP 126/73 10/23/2017  7:36 PM  Temp    Pulse 103 10/23/2017  7:39 PM  Resp 13 10/23/2017  7:39 PM  SpO2 100 % 10/23/2017  7:39 PM  Vitals shown include unvalidated device data.  Last Pain:  Vitals:   10/23/17 1550  TempSrc:   PainSc: 3          Complications: No apparent anesthesia complications

## 2017-10-23 NOTE — ED Triage Notes (Signed)
Pt endorses cutting the tip of his middle finger off at work around 1500 while putting together a fork lift and a heavy object fell on the pt's finger. VSS. Pt has tip of finger in ice bag.

## 2017-10-24 ENCOUNTER — Encounter (HOSPITAL_COMMUNITY): Payer: Self-pay | Admitting: General Surgery

## 2017-10-25 NOTE — Op Note (Signed)
NAME: Jackson Wall, Jackson Wall MEDICAL RECORD [id number] ACCOUNT [id number] DATE OF BIRTH:1985-07-22 FACILITY: [facility] LOCATION: [facility] PHYSICIAN:Brysin Towery C. Terrez Ander, MD  OPERATIVE REPORT  DATE OF PROCEDURE:  10/23/2017  PREOPERATIVE DIAGNOSIS:  Partial tip amputation of the left middle finger.  POSTOPERATIVE DIAGNOSIS:  Partial tip amputation of the left middle finger.  PROCEDURE:   1.  Debridement of left middle finger amputation site including full thickness skin, subcutaneous tissue, nail bed and bone.   2.  Preparation of the tip for grafting material. 3.  Application of a piece of Integra bilayer grafted on the fingertip.  ANESTHESIA:  General.  COMPLICATIONS:  None acute.  INDICATIONS:  The patient is a 32 year old gentleman who was working with some heavy forklift type equipment.  He sustained a complete amputation through the nail of the left middle finger.  He presented to the ER.  I was consulted for definitive repair.   Risks, benefits and alternatives of graft versus revision amputation were discussed with him and he agreed to proceed with either of these.  Consent was obtained.  DESCRIPTION OF PROCEDURE:  The patient was taken to the operating room and placed supine on the operating room table.  Anesthesia was administered without difficulty.  Timeout was performed identifying the correct finger and procedure.  Preoperative  antibiotics were given.  The left upper extremity was prepped and draped in the normal sterile fashion.  A tourniquet was used on the upper arm.  The arm was exsanguinated and the tourniquet was inflated to 250 mmHg.  The wound was evaluated.  There was  a complete transverse amputation at the level of the proximal nail through the distal phalanx with a knife and scissors.  Full thickness skin, subcutaneous tissue and nail bed were sharply debrided to free up any nonviable tissue.  Rongeur was used on  the distal phalanx rongeuring just proximal to the skin  edges.  Next, the tourniquet was released.  Hemostasis was controlled with bipolar cautery.  Following a piece of Integra bilayer was measured to fit the fingertip.  A small portion of the bilayer  that was not used, the Integra portion of it not the silicone portion, was scraped and some of this material was placed in the wound over the bone and in the soft tissue for additional integration.  Following the piece of Integra was trimmed to very  nicely fit the end of the finger and sutured in place circumferentially.  After it was gently pie crusted with interrupted 5-0 nylon sutures.  Xeroform and sterile dressing was applied.  Marcaine was infiltrated around the base of the finger for  postoperative pain control.  The patient was awakened and taken to recovery room in stable condition.  TN/NUANCE  D:10/25/2017 T:10/25/2017 JOB:[id number]

## 2017-11-25 IMAGING — CT CT ABD-PELV W/ CM
2 of 4 series · 16 of 46 positions shown, 18 images · IV contrast (APPLIED)
Comparison: None.

CLINICAL DATA: Right lower quadrant abdominal pain over the last 3
days. nausea.

EXAM:
CT ABDOMEN AND PELVIS WITH CONTRAST
TECHNIQUE: Multidetector CT imaging of the abdomen and pelvis was performed
using the standard protocol following bolus administration of
intravenous contrast.
CONTRAST:  100 cc Esovue-P11

[Series 3: abd/ pelvis 5.0 i30f 2 · axial · 0.72mm/px · z∈[+804,+1199]mm · 13 of 87 slices shown, 15 images]
[im 4/87  soft-tissue]
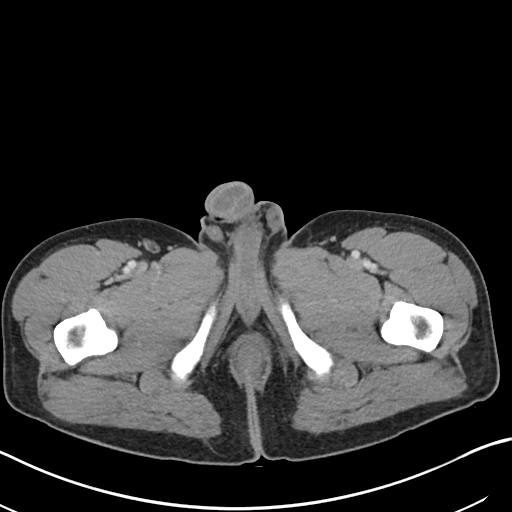
[im 4/87  bone]
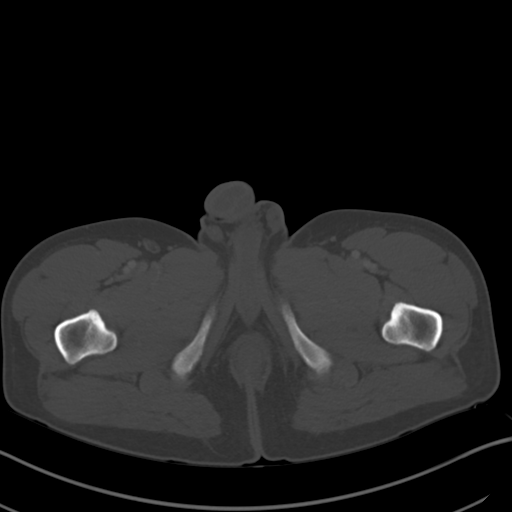
[im 11/87  soft-tissue]
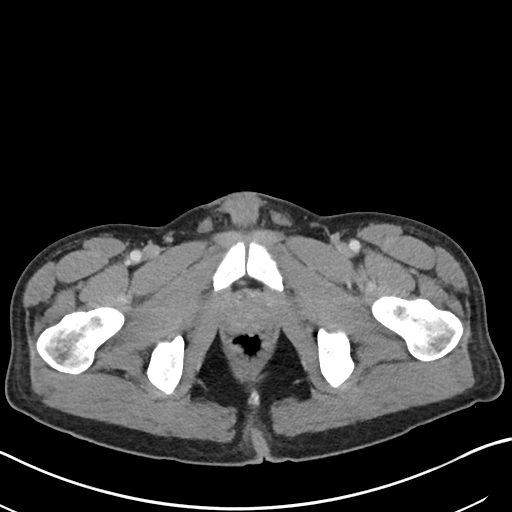
[im 18/87  soft-tissue]
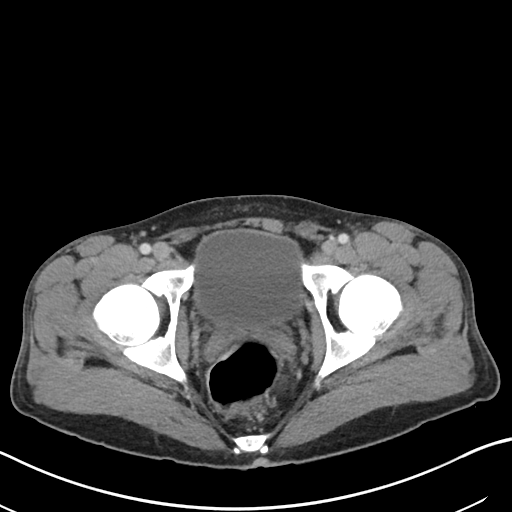
[im 25/87  soft-tissue]
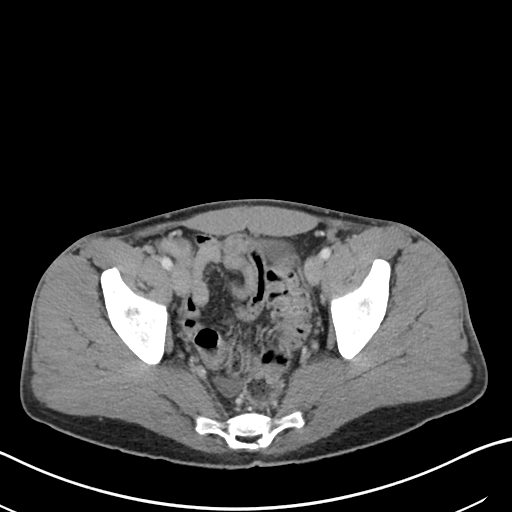
[im 31/87  soft-tissue]
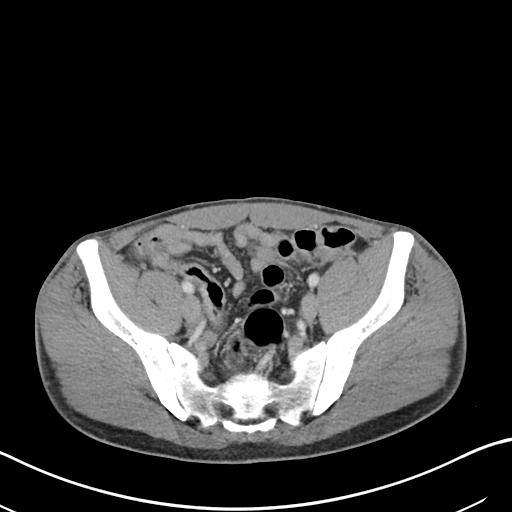
[im 38/87  soft-tissue]
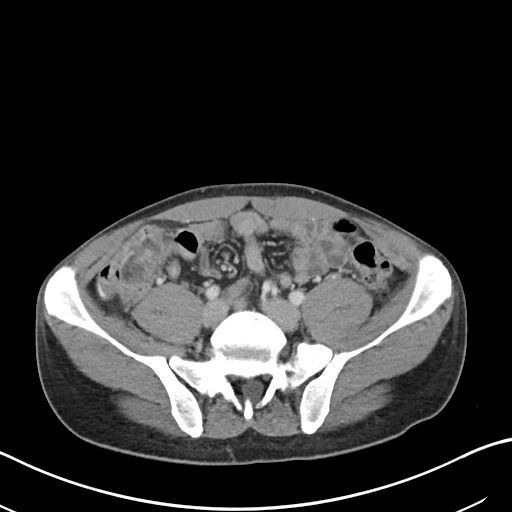
[im 45/87  soft-tissue]
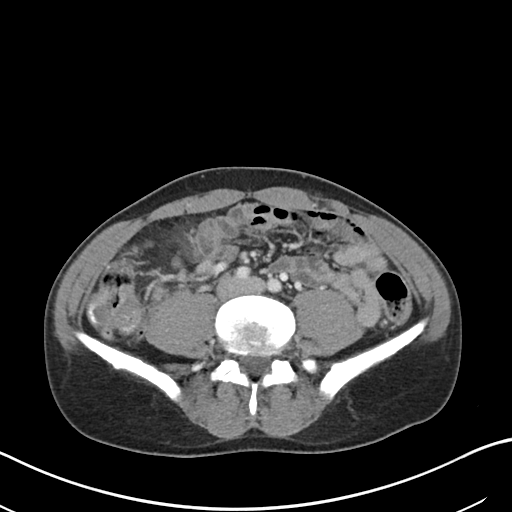
[im 49/87  soft-tissue]
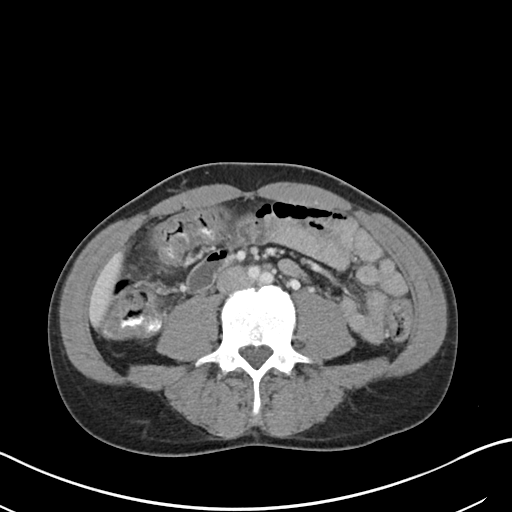
[im 56/87  soft-tissue]
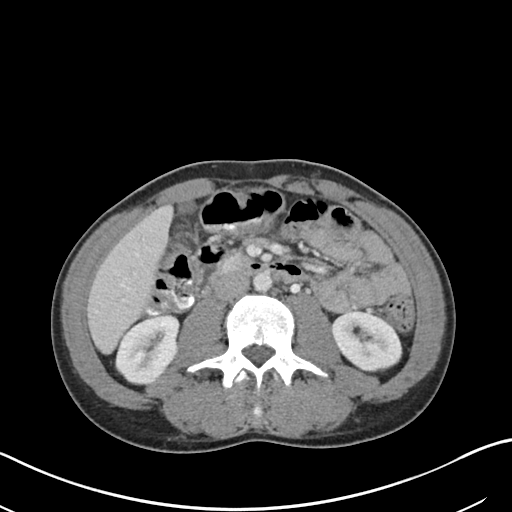
[im 56/87  bone]
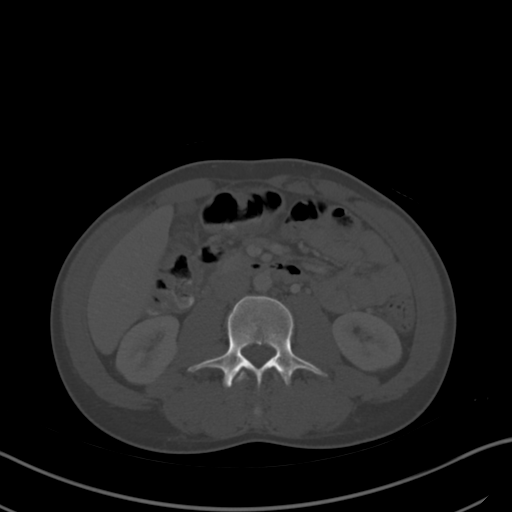
[im 62/87  soft-tissue]
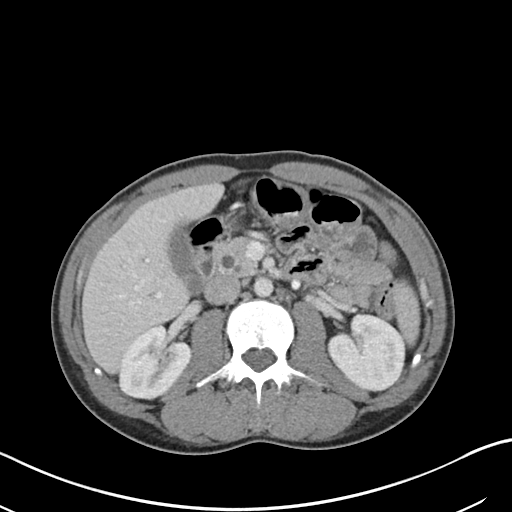
[im 69/87  soft-tissue]
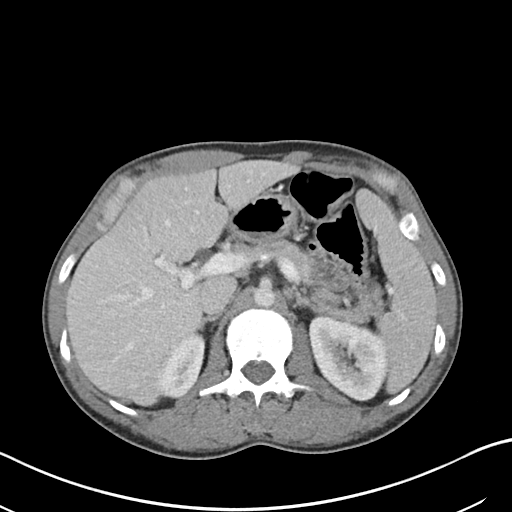
[im 76/87  soft-tissue]
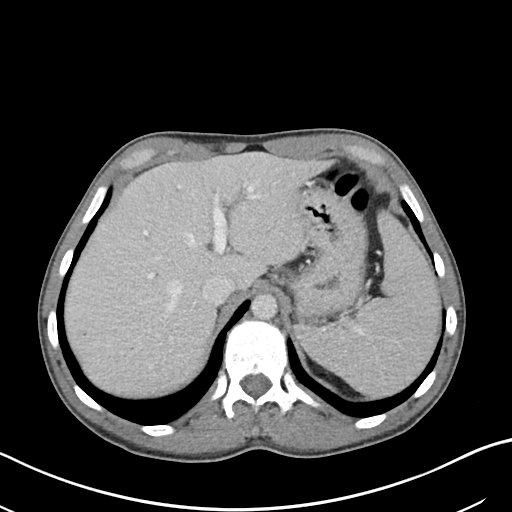
[im 83/87  soft-tissue]
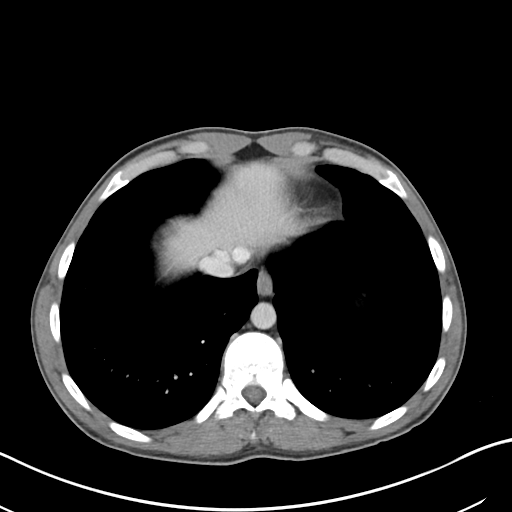

[Series 6: coronal soft tissue · coronal · 0.85mm/px · 3 of 82 slices shown]
[im 28/82  soft-tissue]
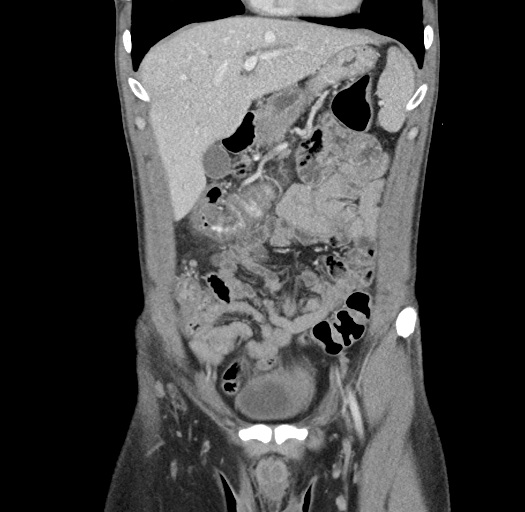
[im 37/82  soft-tissue]
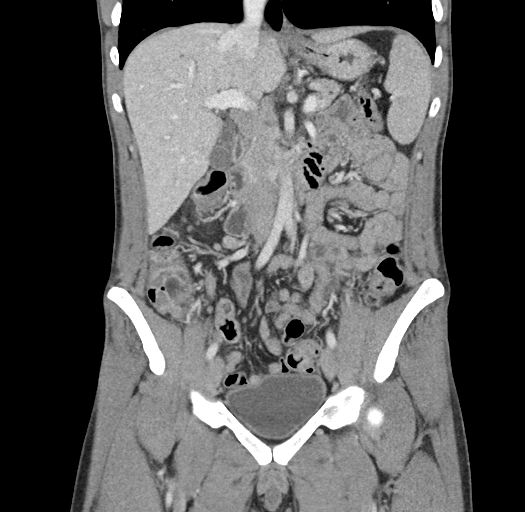
[im 46/82  soft-tissue]
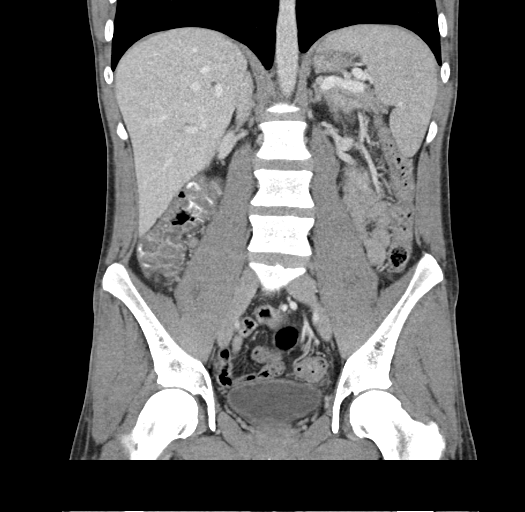

[16 of 46 positions shown; findings below may reference images not displayed]

FINDINGS: Lower chest: Normal

Hepatobiliary: Liver parenchyma is normal. No calcified gallstones.
There is intra and extrahepatic biliary ductal dilatation with the
common duct measuring up to 11 mm in the pancreatic head. No
obstructing lesion is seen.

Pancreas: Normal except for mild prominence of the pancreatic duct.

Spleen: Normal

Adrenals/Urinary Tract: Adrenal glands are normal. Kidneys are
normal. No cyst, mass, stone or hydronephrosis.

Stomach/Bowel: There is wall thickening/edema of the colon from the
cecum to the mid transverse colon. This is nonspecific and could be
due to infectious enteritis or inflammatory bowel disease. Appendix
is shows mild wall thickening in proportion to the remainder of the
right colon. No surrounding soft tissue inflammation. No abnormality
of the terminal ileum is identified. No sign of bowel obstruction.
No free fluid or air. Right-sided mesenteric nodes are slightly
prominent, reactive

Vascular/Lymphatic: Normal

Reproductive: Normal

Other: Normal

Musculoskeletal: Normal
IMPRESSION: Thickening and wall edema of the right colon from the cecum to the
mid transverse colon. Appendix also shows generalized wall
thickening without surrounding inflammatory change. Findings most
likely secondary to infectious enteritis or inflammatory bowel
disease. While appendicitis cannot be excluded, I would doubt that
is present in this case.

Intrahepatic and extra hepatic biliary ductal dilatation of unknown
etiology. Common bile duct measures up to 11 mm. Pancreatic duct is
also slightly prominent. I do not see a hyperdense stone or
discernible pancreatic head/ ampullary mass. MRI of the abdomen/MRCP
with and without contrast recommended for evaluation of this
finding.

## 2019-03-24 IMAGING — DX DG FINGER MIDDLE 2+V*L*
3 series · 3 of 3 positions shown · non-contrast
Comparison: None.

CLINICAL DATA: Left middle finger injury.

EXAM:
LEFT MIDDLE FINGER 2+V

[finger ap]
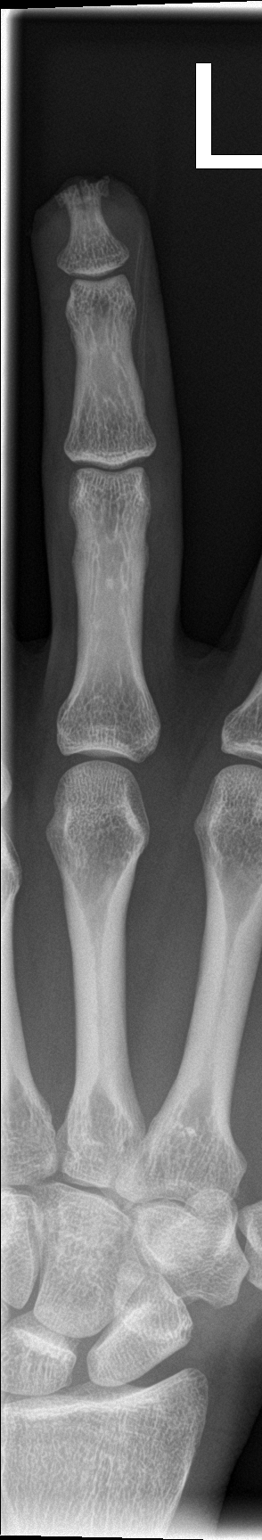

[finger obl]
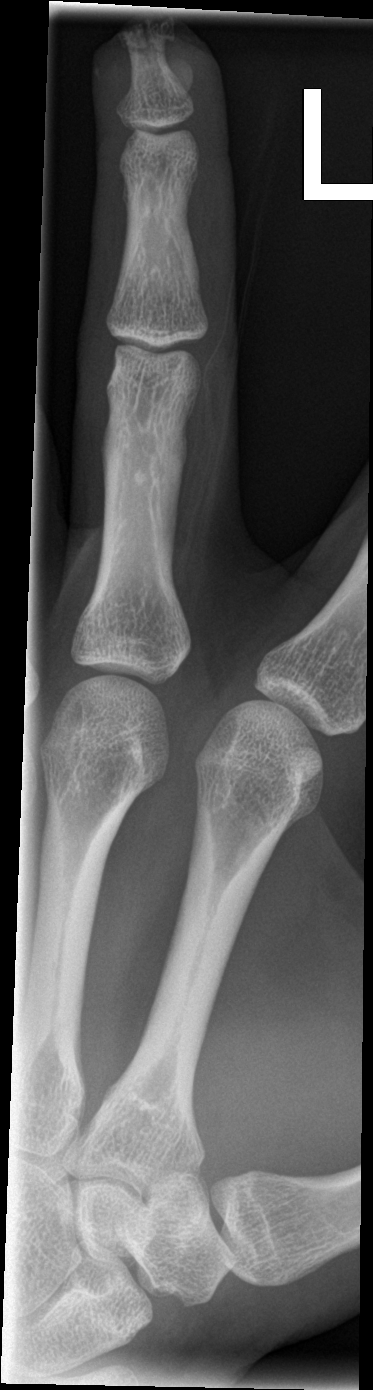

[finger lat]
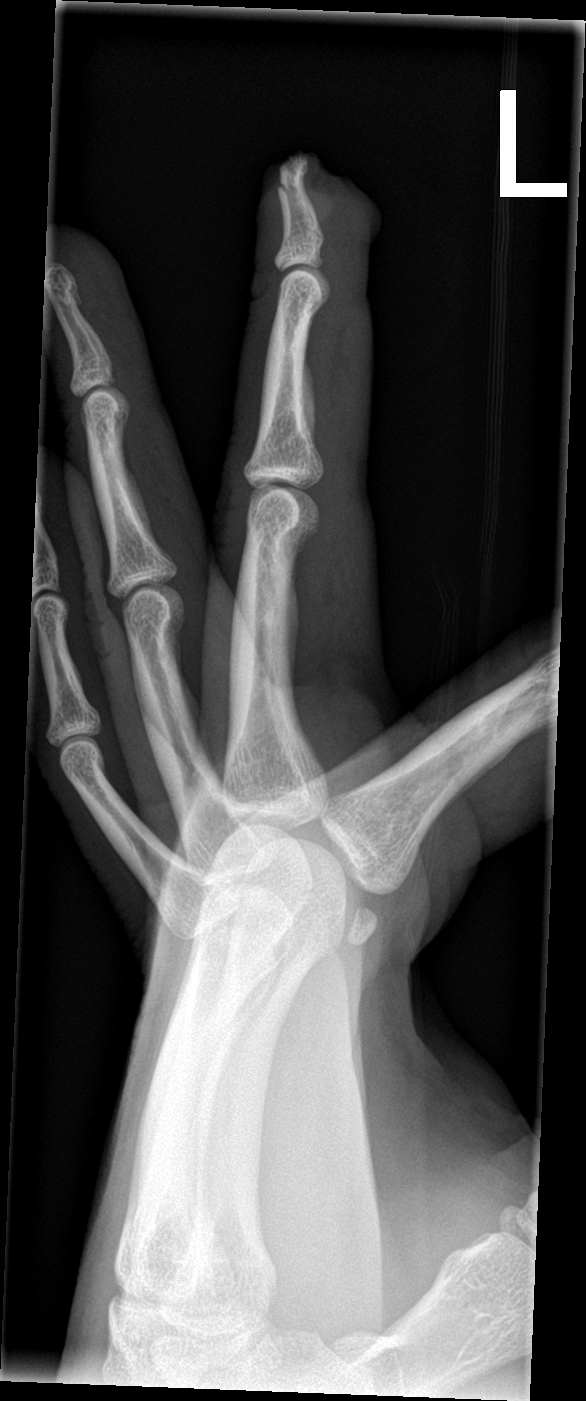

[3 of 3 positions shown; findings below may reference images not displayed]

FINDINGS: Status post traumatic amputation of distal tuft of third distal
phalanx and significant amount of soft tissue. No radiopaque foreign
body is noted. No other bony abnormality is noted.
IMPRESSION: Status posttraumatic and protection of distal tuft of third distal
phalanx and surrounding soft tissues.

## 2021-02-09 ENCOUNTER — Other Ambulatory Visit: Payer: Self-pay

## 2021-02-09 ENCOUNTER — Ambulatory Visit
Admission: EM | Admit: 2021-02-09 | Discharge: 2021-02-09 | Disposition: A | Discharge status: Home / Self Care | Payer: BC Managed Care – PPO | Attending: Physician Assistant | Admitting: Physician Assistant

## 2021-02-09 DIAGNOSIS — R42 Dizziness and giddiness: Secondary | ICD-10-CM | POA: Diagnosis not present

## 2021-02-09 LAB — POCT FASTING CBG KUC MANUAL ENTRY: POCT Glucose (KUC): 109 mg/dL — AB (ref 70–99)

## 2021-02-09 NOTE — ED Provider Notes (Signed)
[facility]    CSN: [id number] Arrival date & time: 02/09/21  1421      History   Chief Complaint Chief Complaint  Patient presents with   Dizziness    HPI Jackson Wall is a 36 y.o. male.   Patient here today for evaluation of lightheadedness. He reports he has experienced same in the past when he felt his blood sugar was dropping. He typically can eat and make symptoms better but states that this did not help today. He has worn holter monitor in the past with no cause of symptoms. He does admit to getting less sleep last night and drinking alcohol and being at a bar last night as well.   The history is provided by the patient.  Dizziness Associated symptoms: no nausea and no vomiting    History reviewed. No pertinent past medical history.  There are no problems to display for this patient.   Past Surgical History:  Procedure Laterality Date   AMPUTATION Left 10/23/2017   Procedure: REVISION AMPUTATION LEFT MIDFINGER;  Surgeon: Knute Neu, MD;  Location: [facility] OR;  Service: Plastics;  Laterality: Left;       Home Medications    Prior to Admission medications   Medication Sig Start Date End Date Taking? Authorizing Provider  cephALEXin (KEFLEX) 500 MG capsule Take 1 capsule (500 mg total) by mouth 4 (four) times daily. 10/23/17   Knute Neu, MD    Family History History reviewed. No pertinent family history.  Social History Social History   Tobacco Use   Smoking status: Every Day    Packs/day: 1.00    Types: Cigarettes   Smokeless tobacco: Never  Substance Use Topics   Alcohol use: Yes   Drug use: No     Allergies   Patient has no known allergies.   Review of Systems Review of Systems  Constitutional:  Negative for chills and fever.  Eyes:  Negative for discharge and redness.  Gastrointestinal:  Negative for nausea and vomiting.  Neurological:  Positive for dizziness and light-headedness. Negative for numbness.    Physical  Exam Triage Vital Signs ED Triage Vitals  Enc Vitals Group     BP 02/09/21 1433 (!) 138/99     Pulse Rate 02/09/21 1433 62     Resp 02/09/21 1433 18     Temp 02/09/21 1433 97.7 F (36.5 C)     Temp Source 02/09/21 1433 Oral     SpO2 02/09/21 1433 98 %     Weight --      Height --      Head Circumference --      Peak Flow --      Pain Score 02/09/21 1434 0     Pain Loc --      Pain Edu? --      Excl. in GC? --    No data found.  Updated Vital Signs BP (!) 138/99 (BP Location: Left Arm)    Pulse 62    Temp 97.7 F (36.5 C) (Oral)    Resp 18    SpO2 98%      Physical Exam Vitals and nursing note reviewed.  Constitutional:      General: He is not in acute distress.    Appearance: Normal appearance. He is not ill-appearing.  HENT:     Head: Normocephalic and atraumatic.  Eyes:     Conjunctiva/sclera: Conjunctivae normal.  Cardiovascular:     Rate and Rhythm: Normal rate and regular rhythm.  Heart sounds: Normal heart sounds. No murmur heard. Pulmonary:     Effort: Pulmonary effort is normal. No respiratory distress.     Breath sounds: Normal breath sounds. No wheezing, rhonchi or rales.  Neurological:     Mental Status: He is alert.  Psychiatric:        Mood and Affect: Mood normal.        Behavior: Behavior normal.        Thought Content: Thought content normal.     UC Treatments / Results  Labs (all labs ordered are listed, but only abnormal results are displayed) Labs Reviewed  POCT FASTING CBG [facility] MANUAL ENTRY - Abnormal; Notable for the following components:      Result Value   POCT Glucose ([facility]) 109 (*)    All other components within normal limits  CBC WITH DIFFERENTIAL/PLATELET  COMPREHENSIVE METABOLIC PANEL  TSH    EKG   Radiology No results found.  Procedures Procedures (including critical care time)  Medications Ordered in UC Medications - No data to display  Initial Impression / Assessment and Plan / UC Course  I have reviewed the  triage vital signs and the nursing notes.  Pertinent labs & imaging results that were available during my care of the patient were reviewed by me and considered in my medical decision making (see chart for details).    Glucose and EKG normal in office. Will order other labs for further evaluation but recommended he report to ED for further evaluation should symptoms not gradually improve or worsen in any way.   Final Clinical Impressions(s) / UC Diagnoses   Final diagnoses:  Lightheadedness   Discharge Instructions   None    ED Prescriptions   None    PDMP not reviewed this encounter.   Tomi Bamberger, PA-C 02/09/21 1544

## 2021-02-09 NOTE — ED Triage Notes (Signed)
Pt c/o lightheaded, dizziness onset today. States he thinks his FSBS is low. 109 during triaged. States had chicken sandwich today around 1130a. States this has been happening to him for years at some point cardiology placed an ambulatory monitor on him after a syncope event but pt states the provider said everything was normal. Denies any endocrine-related dx like hypo/hyperglycemia.

## 2021-02-10 LAB — CBC WITH DIFFERENTIAL/PLATELET
Basophils Absolute: 0 10*3/uL (ref 0.0–0.2)
Basos: 0 %
EOS (ABSOLUTE): 0.2 10*3/uL (ref 0.0–0.4)
Eos: 2 %
Hematocrit: 41.9 % (ref 37.5–51.0)
Hemoglobin: 14.1 g/dL (ref 13.0–17.7)
Immature Grans (Abs): 0 10*3/uL (ref 0.0–0.1)
Immature Granulocytes: 0 %
Lymphocytes Absolute: 1.4 10*3/uL (ref 0.7–3.1)
Lymphs: 13 %
MCH: 30.2 pg (ref 26.6–33.0)
MCHC: 33.7 g/dL (ref 31.5–35.7)
MCV: 90 fL (ref 79–97)
Monocytes Absolute: 0.5 10*3/uL (ref 0.1–0.9)
Monocytes: 5 %
Neutrophils Absolute: 8.1 10*3/uL — ABNORMAL HIGH (ref 1.4–7.0)
Neutrophils: 80 %
Platelets: 237 10*3/uL (ref 150–450)
RBC: 4.67 x10E6/uL (ref 4.14–5.80)
RDW: 12.3 % (ref 11.6–15.4)
WBC: 10.2 10*3/uL (ref 3.4–10.8)

## 2021-02-10 LAB — COMPREHENSIVE METABOLIC PANEL
ALT: 11 IU/L (ref 0–44)
AST: 11 IU/L (ref 0–40)
Albumin/Globulin Ratio: 2.8 — ABNORMAL HIGH (ref 1.2–2.2)
Albumin: 4.8 g/dL (ref 4.0–5.0)
Alkaline Phosphatase: 70 IU/L (ref 44–121)
BUN/Creatinine Ratio: 14 (ref 9–20)
BUN: 13 mg/dL (ref 6–20)
Bilirubin Total: 0.4 mg/dL (ref 0.0–1.2)
CO2: 27 mmol/L (ref 20–29)
Calcium: 9.8 mg/dL (ref 8.7–10.2)
Chloride: 99 mmol/L (ref 96–106)
Creatinine, Ser: 0.94 mg/dL (ref 0.76–1.27)
Globulin, Total: 1.7 g/dL (ref 1.5–4.5)
Glucose: 101 mg/dL — ABNORMAL HIGH (ref 70–99)
Potassium: 4 mmol/L (ref 3.5–5.2)
Sodium: 139 mmol/L (ref 134–144)
Total Protein: 6.5 g/dL (ref 6.0–8.5)
eGFR: 108 mL/min/{1.73_m2} (ref >59)

## 2021-02-10 LAB — TSH: TSH: 1.51 u[IU]/mL (ref 0.450–4.500)
